# Patient Record
Sex: Male | Born: 1945 | Race: White | Hispanic: No | Smoking: Never smoker
Health system: Southern US, Community
[De-identification: ages and names within clinical notes are randomized; demographics above are authoritative.]

## PROBLEM LIST (undated history)

## (undated) DIAGNOSIS — E7800 Pure hypercholesterolemia, unspecified: Secondary | ICD-10-CM

## (undated) DIAGNOSIS — I10 Essential (primary) hypertension: Secondary | ICD-10-CM

## (undated) DIAGNOSIS — M5136 Other intervertebral disc degeneration, lumbar region: Secondary | ICD-10-CM

## (undated) DIAGNOSIS — M51369 Other intervertebral disc degeneration, lumbar region without mention of lumbar back pain or lower extremity pain: Secondary | ICD-10-CM

## (undated) DIAGNOSIS — D1720 Benign lipomatous neoplasm of skin and subcutaneous tissue of unspecified limb: Secondary | ICD-10-CM

## (undated) HISTORY — DX: Benign lipomatous neoplasm of skin and subcutaneous tissue of unspecified limb: D17.20

## (undated) HISTORY — DX: Other intervertebral disc degeneration, lumbar region without mention of lumbar back pain or lower extremity pain: M51.369

## (undated) HISTORY — PX: TONSILLECTOMY: SUR1361

## (undated) HISTORY — PX: INGUINAL HERNIA REPAIR: SUR1180

## (undated) HISTORY — PX: OTHER SURGICAL HISTORY: SHX169

## (undated) HISTORY — PX: CATARACT EXTRACTION, BILATERAL: SHX1313

## (undated) HISTORY — PX: BACK SURGERY: SHX140

## (undated) HISTORY — DX: Essential (primary) hypertension: I10

## (undated) HISTORY — DX: Other intervertebral disc degeneration, lumbar region: M51.36

---

## 2016-07-09 DIAGNOSIS — H2511 Age-related nuclear cataract, right eye: Secondary | ICD-10-CM | POA: Diagnosis not present

## 2016-08-12 ENCOUNTER — Other Ambulatory Visit: Payer: Self-pay | Admitting: Orthopedic Surgery

## 2016-08-12 DIAGNOSIS — M25562 Pain in left knee: Secondary | ICD-10-CM

## 2016-08-12 DIAGNOSIS — R531 Weakness: Secondary | ICD-10-CM

## 2016-08-12 DIAGNOSIS — M2392 Unspecified internal derangement of left knee: Secondary | ICD-10-CM

## 2016-08-20 ENCOUNTER — Ambulatory Visit
Admission: RE | Admit: 2016-08-20 | Discharge: 2016-08-20 | Disposition: A | Discharge status: Home / Self Care | Payer: Medicare Other | Source: Ambulatory Visit | Attending: Orthopedic Surgery | Admitting: Orthopedic Surgery

## 2016-08-20 DIAGNOSIS — M25562 Pain in left knee: Secondary | ICD-10-CM | POA: Diagnosis not present

## 2016-08-20 DIAGNOSIS — M2392 Unspecified internal derangement of left knee: Secondary | ICD-10-CM

## 2016-08-20 DIAGNOSIS — R531 Weakness: Secondary | ICD-10-CM

## 2016-09-01 DIAGNOSIS — S83242A Other tear of medial meniscus, current injury, left knee, initial encounter: Secondary | ICD-10-CM | POA: Diagnosis not present

## 2016-09-01 DIAGNOSIS — M1712 Unilateral primary osteoarthritis, left knee: Secondary | ICD-10-CM | POA: Diagnosis not present

## 2016-09-07 DIAGNOSIS — H2511 Age-related nuclear cataract, right eye: Secondary | ICD-10-CM | POA: Diagnosis not present

## 2016-09-07 DIAGNOSIS — H25811 Combined forms of age-related cataract, right eye: Secondary | ICD-10-CM | POA: Diagnosis not present

## 2016-09-15 DIAGNOSIS — H2512 Age-related nuclear cataract, left eye: Secondary | ICD-10-CM | POA: Diagnosis not present

## 2016-09-28 DIAGNOSIS — H2512 Age-related nuclear cataract, left eye: Secondary | ICD-10-CM | POA: Diagnosis not present

## 2016-09-28 DIAGNOSIS — H25811 Combined forms of age-related cataract, right eye: Secondary | ICD-10-CM | POA: Diagnosis not present

## 2016-10-26 DIAGNOSIS — Z125 Encounter for screening for malignant neoplasm of prostate: Secondary | ICD-10-CM | POA: Diagnosis not present

## 2016-10-26 DIAGNOSIS — E7800 Pure hypercholesterolemia, unspecified: Secondary | ICD-10-CM | POA: Diagnosis not present

## 2016-10-27 DIAGNOSIS — E7800 Pure hypercholesterolemia, unspecified: Secondary | ICD-10-CM | POA: Diagnosis not present

## 2016-10-27 DIAGNOSIS — Z125 Encounter for screening for malignant neoplasm of prostate: Secondary | ICD-10-CM | POA: Diagnosis not present

## 2016-11-19 DIAGNOSIS — H25813 Combined forms of age-related cataract, bilateral: Secondary | ICD-10-CM | POA: Diagnosis not present

## 2016-11-19 DIAGNOSIS — H40013 Open angle with borderline findings, low risk, bilateral: Secondary | ICD-10-CM | POA: Diagnosis not present

## 2016-11-19 DIAGNOSIS — H26491 Other secondary cataract, right eye: Secondary | ICD-10-CM | POA: Diagnosis not present

## 2016-12-10 DIAGNOSIS — H26492 Other secondary cataract, left eye: Secondary | ICD-10-CM | POA: Diagnosis not present

## 2016-12-10 DIAGNOSIS — Z961 Presence of intraocular lens: Secondary | ICD-10-CM | POA: Diagnosis not present

## 2017-02-19 DIAGNOSIS — D1801 Hemangioma of skin and subcutaneous tissue: Secondary | ICD-10-CM | POA: Diagnosis not present

## 2017-02-19 DIAGNOSIS — M79675 Pain in left toe(s): Secondary | ICD-10-CM | POA: Diagnosis not present

## 2017-03-18 DIAGNOSIS — H40013 Open angle with borderline findings, low risk, bilateral: Secondary | ICD-10-CM | POA: Diagnosis not present

## 2017-03-18 DIAGNOSIS — Z961 Presence of intraocular lens: Secondary | ICD-10-CM | POA: Diagnosis not present

## 2017-03-18 DIAGNOSIS — H26492 Other secondary cataract, left eye: Secondary | ICD-10-CM | POA: Diagnosis not present

## 2017-04-30 DIAGNOSIS — Z23 Encounter for immunization: Secondary | ICD-10-CM | POA: Diagnosis not present

## 2018-03-24 DIAGNOSIS — S40011A Contusion of right shoulder, initial encounter: Secondary | ICD-10-CM | POA: Diagnosis not present

## 2018-03-24 DIAGNOSIS — S9032XA Contusion of left foot, initial encounter: Secondary | ICD-10-CM | POA: Diagnosis not present

## 2018-03-24 DIAGNOSIS — S40021A Contusion of right upper arm, initial encounter: Secondary | ICD-10-CM | POA: Diagnosis not present

## 2018-03-24 DIAGNOSIS — S300XXA Contusion of lower back and pelvis, initial encounter: Secondary | ICD-10-CM | POA: Diagnosis not present

## 2018-05-16 DIAGNOSIS — Z125 Encounter for screening for malignant neoplasm of prostate: Secondary | ICD-10-CM | POA: Diagnosis not present

## 2018-05-16 DIAGNOSIS — Z1159 Encounter for screening for other viral diseases: Secondary | ICD-10-CM | POA: Diagnosis not present

## 2018-05-16 DIAGNOSIS — D1720 Benign lipomatous neoplasm of skin and subcutaneous tissue of unspecified limb: Secondary | ICD-10-CM | POA: Diagnosis not present

## 2018-05-16 DIAGNOSIS — Z0000 Encounter for general adult medical examination without abnormal findings: Secondary | ICD-10-CM | POA: Diagnosis not present

## 2018-05-16 DIAGNOSIS — Z23 Encounter for immunization: Secondary | ICD-10-CM | POA: Diagnosis not present

## 2018-05-16 DIAGNOSIS — E7800 Pure hypercholesterolemia, unspecified: Secondary | ICD-10-CM | POA: Diagnosis not present

## 2018-05-17 DIAGNOSIS — Z1159 Encounter for screening for other viral diseases: Secondary | ICD-10-CM | POA: Diagnosis not present

## 2018-05-17 DIAGNOSIS — Z0000 Encounter for general adult medical examination without abnormal findings: Secondary | ICD-10-CM | POA: Diagnosis not present

## 2018-05-17 DIAGNOSIS — Z125 Encounter for screening for malignant neoplasm of prostate: Secondary | ICD-10-CM | POA: Diagnosis not present

## 2018-05-17 DIAGNOSIS — E7800 Pure hypercholesterolemia, unspecified: Secondary | ICD-10-CM | POA: Diagnosis not present

## 2018-07-11 ENCOUNTER — Ambulatory Visit: Payer: Self-pay | Admitting: Surgery

## 2018-07-11 DIAGNOSIS — D1721 Benign lipomatous neoplasm of skin and subcutaneous tissue of right arm: Secondary | ICD-10-CM | POA: Diagnosis not present

## 2018-07-11 NOTE — H&P (Signed)
History of Present Illness Steve Ruiz. Ricahrd Schwager MD; 07/11/2018 10:17 AM) The patient is a 73 year old male who presents with a complaint of Mass. Referred by Dr. Radene Ou for right upper extremity lipomas  This is a 73 year old male in good health who presents with multiple subcutaneous lipomas over both upper extremities and upper torso. Most of these are asymptomatic. However he has a cluster of 3 lipomas over his right biceps that has become fairly large and has begun causing some discomfort. This has progressed to the point that he would like to have these removed to alleviate discomfort. These areas have never become infected. No imaging.   Past Surgical History Emeline Gins, [place]; 07/11/2018 9:54 AM) Colon Polyp Removal - Colonoscopy Spinal Surgery - Lower Back Tonsillectomy Vasectomy  Diagnostic Studies History Emeline Gins, [place]; 07/11/2018 9:54 AM) Colonoscopy 5-10 years ago  Allergies Emeline Gins, CMA; 07/11/2018 9:55 AM) No Known Drug Allergies [07/11/2018]: Allergies Reconciled  Medication History Emeline Gins, CMA; 07/11/2018 9:55 AM) Atorvastatin Calcium (10MG  Tablet, Oral) Active. Aspirin (81MG  Tablet DR, Oral) Active. ZyrTEC Allergy (10MG  Tablet, Oral) Active. Medications Reconciled  Social History Emeline Gins, [place]; 07/11/2018 9:54 AM) Alcohol use Moderate alcohol use. Caffeine use Carbonated beverages, Coffee. Illicit drug use Remotely quit drug use. Tobacco use Never smoker.  Family History Emeline Gins, [place]; 07/11/2018 9:54 AM) Cerebrovascular Accident Father. Respiratory Condition Mother. Thyroid problems Brother.  Other Problems Emeline Gins, [place]; 07/11/2018 9:54 AM) Hypercholesterolemia     Review of Systems Emeline Gins CMA; 07/11/2018 9:54 AM) General Not Present- Appetite Loss, Chills, Fatigue, Fever, Night Sweats, Weight Gain and Weight Loss. Skin Not Present- Change in Wart/Mole, Dryness, Hives, Jaundice, New  Lesions, Non-Healing Wounds, Rash and Ulcer. HEENT Present- Ringing in the Ears and Sinus Pain. Not Present- Earache, Hearing Loss, Hoarseness, Nose Bleed, Oral Ulcers, Seasonal Allergies, Sore Throat, Visual Disturbances, Wears glasses/contact lenses and Yellow Eyes. Respiratory Not Present- Bloody sputum, Chronic Cough, Difficulty Breathing, Snoring and Wheezing. Breast Not Present- Breast Mass, Breast Pain, Nipple Discharge and Skin Changes. Cardiovascular Present- Leg Cramps. Not Present- Chest Pain, Difficulty Breathing Lying Down, Palpitations, Rapid Heart Rate, Shortness of Breath and Swelling of Extremities. Gastrointestinal Present- Change in Bowel Habits. Not Present- Abdominal Pain, Bloating, Bloody Stool, Chronic diarrhea, Constipation, Difficulty Swallowing, Excessive gas, Gets full quickly at meals, Hemorrhoids, Indigestion, Nausea, Rectal Pain and Vomiting. Male Genitourinary Not Present- Blood in Urine, Change in Urinary Stream, Frequency, Impotence, Nocturia, Painful Urination, Urgency and Urine Leakage. Musculoskeletal Present- Joint Pain. Not Present- Back Pain, Joint Stiffness, Muscle Pain, Muscle Weakness and Swelling of Extremities. Neurological Not Present- Decreased Memory, Fainting, Headaches, Numbness, Seizures, Tingling, Tremor, Trouble walking and Weakness. Endocrine Not Present- Cold Intolerance, Excessive Hunger, Hair Changes, Heat Intolerance, Hot flashes and New Diabetes.  Vitals Emeline Gins CMA; 07/11/2018 9:55 AM) 07/11/2018 9:54 AM Weight: 158.25 lb Height: 70in Body Surface Area: 1.89 m Body Mass Index: 22.71 kg/m  Temp.: 97.2F  Pulse: 77 (Regular)  BP: 152/84 (Sitting, Left Arm, Standard)      Physical Exam Rodman Key K. Daisja Kessinger MD; 07/11/2018 10:19 AM)  The physical exam findings are as follows: Note:WDWN in NAD Right upper extremity - Protruding cluster of lipomas over biceps - 3 cm, 2 cm, 1 cm. No skin changes. Mildly tender to  palpation Multiple other smaller lipomas over right forearm/ right triceps region - asymptomatic    Assessment & Plan Rodman Key K. 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 The surgical procedure has been discussed with the patient. Potential risks, benefits, alternative treatments, and expected outcomes have been explained. All of the patient's questions at this time have been answered. The likelihood of reaching the patient's treatment goal is good. The patient understand the proposed surgical procedure and wishes to proceed.   Steve Ruiz. Georgette Dover, MD, [facility]  General/ Trauma Surgery Beeper [phone]  07/11/2018 10:19 AM

## 2018-07-22 DIAGNOSIS — M791 Myalgia, unspecified site: Secondary | ICD-10-CM | POA: Diagnosis not present

## 2018-08-02 DIAGNOSIS — D1721 Benign lipomatous neoplasm of skin and subcutaneous tissue of right arm: Secondary | ICD-10-CM | POA: Diagnosis not present

## 2018-12-20 DIAGNOSIS — M791 Myalgia, unspecified site: Secondary | ICD-10-CM | POA: Diagnosis not present

## 2018-12-20 DIAGNOSIS — E7800 Pure hypercholesterolemia, unspecified: Secondary | ICD-10-CM | POA: Diagnosis not present

## 2019-02-22 DIAGNOSIS — M542 Cervicalgia: Secondary | ICD-10-CM | POA: Diagnosis not present

## 2019-02-22 DIAGNOSIS — M25511 Pain in right shoulder: Secondary | ICD-10-CM | POA: Diagnosis not present

## 2019-02-22 DIAGNOSIS — M67912 Unspecified disorder of synovium and tendon, left shoulder: Secondary | ICD-10-CM | POA: Diagnosis not present

## 2019-02-28 DIAGNOSIS — S46012D Strain of muscle(s) and tendon(s) of the rotator cuff of left shoulder, subsequent encounter: Secondary | ICD-10-CM | POA: Diagnosis not present

## 2019-02-28 DIAGNOSIS — S46011D Strain of muscle(s) and tendon(s) of the rotator cuff of right shoulder, subsequent encounter: Secondary | ICD-10-CM | POA: Diagnosis not present

## 2019-02-28 DIAGNOSIS — M47812 Spondylosis without myelopathy or radiculopathy, cervical region: Secondary | ICD-10-CM | POA: Diagnosis not present

## 2019-03-06 DIAGNOSIS — S46012D Strain of muscle(s) and tendon(s) of the rotator cuff of left shoulder, subsequent encounter: Secondary | ICD-10-CM | POA: Diagnosis not present

## 2019-03-06 DIAGNOSIS — M47812 Spondylosis without myelopathy or radiculopathy, cervical region: Secondary | ICD-10-CM | POA: Diagnosis not present

## 2019-03-06 DIAGNOSIS — S46011D Strain of muscle(s) and tendon(s) of the rotator cuff of right shoulder, subsequent encounter: Secondary | ICD-10-CM | POA: Diagnosis not present

## 2019-03-08 DIAGNOSIS — S46012D Strain of muscle(s) and tendon(s) of the rotator cuff of left shoulder, subsequent encounter: Secondary | ICD-10-CM | POA: Diagnosis not present

## 2019-03-08 DIAGNOSIS — M47812 Spondylosis without myelopathy or radiculopathy, cervical region: Secondary | ICD-10-CM | POA: Diagnosis not present

## 2019-03-08 DIAGNOSIS — S46011D Strain of muscle(s) and tendon(s) of the rotator cuff of right shoulder, subsequent encounter: Secondary | ICD-10-CM | POA: Diagnosis not present

## 2019-03-14 ENCOUNTER — Other Ambulatory Visit: Payer: Self-pay

## 2019-03-14 ENCOUNTER — Emergency Department (HOSPITAL_COMMUNITY)
Admission: EM | Admit: 2019-03-14 | Discharge: 2019-03-14 | Disposition: A | Discharge status: Home / Self Care | Payer: Medicare Other | Attending: Emergency Medicine | Admitting: Emergency Medicine

## 2019-03-14 ENCOUNTER — Encounter (HOSPITAL_COMMUNITY): Payer: Self-pay

## 2019-03-14 ENCOUNTER — Emergency Department (HOSPITAL_COMMUNITY): Payer: Medicare Other

## 2019-03-14 DIAGNOSIS — R4020 Unspecified coma: Secondary | ICD-10-CM | POA: Diagnosis not present

## 2019-03-14 DIAGNOSIS — R42 Dizziness and giddiness: Secondary | ICD-10-CM | POA: Diagnosis present

## 2019-03-14 DIAGNOSIS — I10 Essential (primary) hypertension: Secondary | ICD-10-CM | POA: Diagnosis not present

## 2019-03-14 HISTORY — DX: Pure hypercholesterolemia, unspecified: E78.00

## 2019-03-14 LAB — COMPREHENSIVE METABOLIC PANEL
ALT: 15 U/L (ref 0–44)
AST: 22 U/L (ref 15–41)
Albumin: 3.7 g/dL (ref 3.5–5.0)
Alkaline Phosphatase: 74 U/L (ref 38–126)
Anion gap: 11 (ref 5–15)
BUN: 19 mg/dL (ref 8–23)
CO2: 25 mmol/L (ref 22–32)
Calcium: 9.1 mg/dL (ref 8.9–10.3)
Chloride: 106 mmol/L (ref 98–111)
Creatinine, Ser: 0.78 mg/dL (ref 0.61–1.24)
GFR calc Af Amer: >60 mL/min (ref >60)
GFR calc non Af Amer: >60 mL/min (ref >60)
Glucose, Bld: 105 mg/dL — ABNORMAL HIGH (ref 70–99)
Potassium: 4 mmol/L (ref 3.5–5.1)
Sodium: 142 mmol/L (ref 135–145)
Total Bilirubin: 1.4 mg/dL — ABNORMAL HIGH (ref 0.3–1.2)
Total Protein: 6.3 g/dL — ABNORMAL LOW (ref 6.5–8.1)

## 2019-03-14 LAB — CBC WITH DIFFERENTIAL/PLATELET
Abs Immature Granulocytes: 0.01 10*3/uL (ref 0.00–0.07)
Basophils Absolute: 0 10*3/uL (ref 0.0–0.1)
Basophils Relative: 1 %
Eosinophils Absolute: 0.1 10*3/uL (ref 0.0–0.5)
Eosinophils Relative: 1 %
HCT: 42.8 % (ref 39.0–52.0)
Hemoglobin: 14 g/dL (ref 13.0–17.0)
Immature Granulocytes: 0 %
Lymphocytes Relative: 15 %
Lymphs Abs: 0.9 10*3/uL (ref 0.7–4.0)
MCH: 34.1 pg — ABNORMAL HIGH (ref 26.0–34.0)
MCHC: 32.7 g/dL (ref 30.0–36.0)
MCV: 104.4 fL — ABNORMAL HIGH (ref 80.0–100.0)
Monocytes Absolute: 0.6 10*3/uL (ref 0.1–1.0)
Monocytes Relative: 10 %
Neutro Abs: 4.4 10*3/uL (ref 1.7–7.7)
Neutrophils Relative %: 73 %
Platelets: 250 10*3/uL (ref 150–400)
RBC: 4.1 MIL/uL — ABNORMAL LOW (ref 4.22–5.81)
RDW: 11.9 % (ref 11.5–15.5)
WBC: 6.1 10*3/uL (ref 4.0–10.5)
nRBC: 0 % (ref 0.0–0.2)

## 2019-03-14 MED ORDER — LISINOPRIL 10 MG PO TABS: 10.0000 mg | ORAL_TABLET | Freq: Every day | ORAL | 0 refills | Status: DC

## 2019-03-14 NOTE — ED Triage Notes (Signed)
Patient reports that he was at the beach Saturday and states he was not drinking liquids, working in the heat and states he thought his speech was slurred. Patient states he went in the house rested and next day he felt "jumpy" and felt like he was not in control of everything. Patient states he has been feeling better each day and has consciously tried to rehydrate. Patient states he was able to walk 9 miles yesterday and the day before

## 2019-03-14 NOTE — Discharge Instructions (Addendum)
Call your doctor today to schedule follow-up for your blood pressure

## 2019-03-14 NOTE — ED Provider Notes (Signed)
[facility] DEPT Provider Note   CSN: [id number] Arrival date & time: 03/14/19  [id number]     History   Chief Complaint Chief Complaint  Patient presents with  . Dizziness    HPI RYER KERWICK is a 73 y.o. male.     73 year old male who this weekend felt lightheaded and dizzy after working outside.  Patient states he felt off balance and progressively weak.  Denies any headache.  States symptoms became better when he went inside.  Over the last several days he has been feeling better.  He cannot walk unassisted.  Denies any focal weakness.  No visual changes.  No chest or abdominal discomfort.  No medications were used for this.  Is concerned that he may have had a stroke.     Past Medical History:  Diagnosis Date  . High cholesterol     There are no active problems to display for this patient.   Past Surgical History:  Procedure Laterality Date  . BACK SURGERY          Home Medications    Prior to Admission medications   Not on File    Family History Family History  Problem Relation Age of Onset  . Emphysema Mother   . Stroke Father     Social History Social History   Tobacco Use  . Smoking status: Never Smoker  . Smokeless tobacco: Never Used  Substance Use Topics  . Alcohol use: Yes  . Drug use: Never     Allergies   Patient has no known allergies.   Review of Systems Review of Systems  All other systems reviewed and are negative.    Physical Exam Updated Vital Signs BP (!) 204/88 (BP Location: Left Arm)   Pulse (!) 57   Temp 98 F (36.7 C) (Oral)   Resp 16   Ht 1.778 m (5\' 10" )   Wt 68 kg   SpO2 98%   BMI 21.52 kg/m   Physical Exam Vitals signs and nursing note reviewed.  Constitutional:      General: He is not in acute distress.    Appearance: Normal appearance. He is well-developed. He is not toxic-appearing.  HENT:     Head: Normocephalic and atraumatic.  Eyes:     General: Lids are  normal.     Conjunctiva/sclera: Conjunctivae normal.     Pupils: Pupils are equal, round, and reactive to light.  Neck:     Musculoskeletal: Normal range of motion and neck supple.     Thyroid: No thyroid mass.     Trachea: No tracheal deviation.  Cardiovascular:     Rate and Rhythm: Normal rate and regular rhythm.     Heart sounds: Normal heart sounds. No murmur. No gallop.   Pulmonary:     Effort: Pulmonary effort is normal. No respiratory distress.     Breath sounds: Normal breath sounds. No stridor. No decreased breath sounds, wheezing, rhonchi or rales.  Abdominal:     General: Bowel sounds are normal. There is no distension.     Palpations: Abdomen is soft.     Tenderness: There is no abdominal tenderness. There is no rebound.  Musculoskeletal: Normal range of motion.        General: No tenderness.  Skin:    General: Skin is warm and dry.     Findings: No abrasion or rash.  Neurological:     Mental Status: He is alert and oriented to person, place, and time.  GCS: GCS eye subscore is 4. GCS verbal subscore is 5. GCS motor subscore is 6.     Cranial Nerves: No cranial nerve deficit.     Sensory: No sensory deficit.     Motor: Weakness present. No tremor.     Coordination: Coordination normal.     Gait: Gait and tandem walk normal.     Comments: Strength is 5 of 5 throughout  Psychiatric:        Speech: Speech normal.        Behavior: Behavior normal.      ED Treatments / Results  Labs (all labs ordered are listed, but only abnormal results are displayed) Labs Reviewed  CBC WITH DIFFERENTIAL/PLATELET  COMPREHENSIVE METABOLIC PANEL    EKG None  Radiology No results found.  Procedures Procedures (including critical care time)  Medications Ordered in ED Medications - No data to display   Initial Impression / Assessment and Plan / ED Course  I have reviewed the triage vital signs and the nursing notes.  Pertinent labs & imaging results that were  available during my care of the patient were reviewed by me and considered in my medical decision making (see chart for details).        Patient's laboratory studies reviewed and without significant abnormality.  Head CT without acute findings.  Blood pressure has been elevated here and suspect this is etiology of patient's symptoms.  Will start on lisinopril and patient follow-up with his doctor  Final Clinical Impressions(s) / ED Diagnoses   Final diagnoses:  None    ED Discharge Orders    None       Lacretia Leigh, MD 03/14/19 1122

## 2019-03-20 ENCOUNTER — Other Ambulatory Visit: Payer: Self-pay | Admitting: Family Medicine

## 2019-03-20 DIAGNOSIS — R42 Dizziness and giddiness: Secondary | ICD-10-CM | POA: Diagnosis not present

## 2019-03-20 DIAGNOSIS — Q670 Congenital facial asymmetry: Secondary | ICD-10-CM | POA: Diagnosis not present

## 2019-03-20 DIAGNOSIS — Z6821 Body mass index (BMI) 21.0-21.9, adult: Secondary | ICD-10-CM | POA: Diagnosis not present

## 2019-03-20 DIAGNOSIS — E7800 Pure hypercholesterolemia, unspecified: Secondary | ICD-10-CM | POA: Diagnosis not present

## 2019-03-21 ENCOUNTER — Encounter: Payer: Self-pay | Admitting: *Deleted

## 2019-03-21 ENCOUNTER — Other Ambulatory Visit: Payer: Self-pay

## 2019-03-21 ENCOUNTER — Other Ambulatory Visit (HOSPITAL_COMMUNITY): Payer: Self-pay | Admitting: Family Medicine

## 2019-03-21 ENCOUNTER — Ambulatory Visit (HOSPITAL_COMMUNITY)
Admission: RE | Admit: 2019-03-21 | Discharge: 2019-03-21 | Disposition: A | Discharge status: Home / Self Care | Payer: Medicare Other | Source: Ambulatory Visit | Attending: Family Medicine | Admitting: Family Medicine

## 2019-03-21 DIAGNOSIS — Q670 Congenital facial asymmetry: Secondary | ICD-10-CM | POA: Diagnosis not present

## 2019-03-21 DIAGNOSIS — R42 Dizziness and giddiness: Secondary | ICD-10-CM | POA: Insufficient documentation

## 2019-03-22 ENCOUNTER — Encounter: Payer: Self-pay | Admitting: Neurology

## 2019-03-22 ENCOUNTER — Ambulatory Visit (INDEPENDENT_AMBULATORY_CARE_PROVIDER_SITE_OTHER): Payer: Medicare Other | Admitting: Neurology

## 2019-03-22 VITALS — BP 168/80 | HR 58 | Temp 98.1°F | Ht 70.0 in | Wt 150.5 lb

## 2019-03-22 DIAGNOSIS — R9089 Other abnormal findings on diagnostic imaging of central nervous system: Secondary | ICD-10-CM | POA: Diagnosis not present

## 2019-03-22 DIAGNOSIS — R531 Weakness: Secondary | ICD-10-CM | POA: Diagnosis not present

## 2019-03-22 DIAGNOSIS — I639 Cerebral infarction, unspecified: Secondary | ICD-10-CM

## 2019-03-22 NOTE — Progress Notes (Signed)
PATIENT: Steve Ruiz DOB: 02-27-1946  Chief Complaint  Patient presents with  . r/o TIA    He is here with his daughter, Steve Ruiz.  Reports an episode of stumbling gait and slurred speech over Labor Day weekend.  Felt he was dehydrated.  A few days later, he had another episode of unsteadiness, mild right facial droop, difficulty using right hand (gives the example of having a hard time brushing his teeth).   He went to ED and had CT head.  He had a MRI brain on 03/21/2019 and has not been provided with the results.   Marland Kitchen PCP    [place], Steve [place], Steve Ruiz     HISTORICAL  Steve Ruiz is a 73 year old male, seen in request by his primary care Steve Ruiz for evaluation of stroke, initial evaluation was on March 22, 2019, she is accompanied by her daughter Steve Ruiz at today's visit.   I have reviewed and summarized the referring note from the referring physician. He had a history of hyperlipidemia, has been taking Lipitor 10 mg daily, aspirin 81 mg for many years, is very active, walk 10 miles each day, on March 11, 2019, he missed his breath first, walked outside for few hours, then around 1 PM, he separately felt stumbling, slurred speech, right side heaviness, also noticed right facial droop, right upper extremity clumsiness, symptoms has been persistent since its onset, he described difficulty driving, when he turned his neck to the right side, need a while to refocus, but overall his symptoms has much improved over the past few days, he still has mild clumsiness of his right hand, noticed that when he was writing, brushing his teeth, or walking  I personally reviewed CT head on March 14, 2019, no acute abnormality, mild small vessel disease  MRI of the brain March 21, 2019, 8 mm T2 bright focus in the left pons, also positive on DWI, subacute stroke versus developmental venous anomaly, radiology has suggested MRI of brain with contrast  REVIEW OF SYSTEMS: Full 14 system  review of systems performed and notable only for as above All other review of systems were negative.  ALLERGIES: Allergies  Allergen Reactions  . Simvastatin Other (See Comments)    Joint pain    HOME MEDICATIONS: Current Outpatient Medications  Medication Sig Dispense Refill  . aspirin EC 81 MG tablet Take 81 mg by mouth daily.    Marland Kitchen atorvastatin (LIPITOR) 10 MG tablet Take 10 mg by mouth daily.    . cetirizine-pseudoephedrine (ZYRTEC-D ALLERGY & CONGESTION) 5-120 MG tablet Take 1 tablet by mouth 2 (two) times daily as needed for allergies.    Marland Kitchen ibuprofen (ADVIL) 200 MG tablet Take 200 mg by mouth as needed.    Marland Kitchen lisinopril (ZESTRIL) 10 MG tablet Take 1 tablet (10 mg total) by mouth daily. 30 tablet 0   No current facility-administered medications for this visit.     PAST MEDICAL HISTORY: Past Medical History:  Diagnosis Date  . DDD (degenerative disc disease), lumbar   . High cholesterol   . Hypertension   . Lipoma of arm     PAST SURGICAL HISTORY: Past Surgical History:  Procedure Laterality Date  . arm surgery Right    lipoma removal  . BACK SURGERY    . CATARACT EXTRACTION, BILATERAL    . INGUINAL HERNIA REPAIR    . TONSILLECTOMY      FAMILY HISTORY: Family History  Problem Relation Age of Onset  . Emphysema Mother   .  Hypertension Mother   . Stroke Father   . Diabetes Father   . Cancer Brother        unsure of origin - started in throat  . Heart disease Brother        stent    SOCIAL HISTORY: Social History   Socioeconomic History  . Marital status: Legally Separated    Spouse name: Not on file  . Number of children: 3  . Years of education: 92  . Highest education level: High school graduate  Occupational History  . Occupation: Tree surgeon  Social Needs  . Financial resource strain: Not on file  . Food insecurity    Worry: Not on file    Inability: Not on file  . Transportation needs    Medical: Not on file    Non-medical: Not on file   Tobacco Use  . Smoking status: Never Smoker  . Smokeless tobacco: Never Used  Substance and Sexual Activity  . Alcohol use: Yes    Comment: several drinks per week  . Drug use: Never  . Sexual activity: Not on file  Lifestyle  . Physical activity    Days per week: Not on file    Minutes per session: Not on file  . Stress: Not on file  Relationships  . Social Herbalist on phone: Not on file    Gets together: Not on file    Attends religious service: Not on file    Active member of club or organization: Not on file    Attends meetings of clubs or organizations: Not on file    Relationship status: Not on file  . Intimate partner violence    Fear of current or ex partner: Not on file    Emotionally abused: Not on file    Physically abused: Not on file    Forced sexual activity: Not on file  Other Topics Concern  . Not on file  Social History Narrative   Lives at home alone.   4-5 cups caffeine per day.   Right-handed.     PHYSICAL EXAM   Vitals:   03/22/19 1523  BP: (!) 168/80  Pulse: (!) 58  Temp: 98.1 F (36.7 C)  Weight: 150 lb 8 oz (68.3 kg)  Height: 5\' 10"  (1.778 m)    Not recorded      Body mass index is 21.59 kg/m.  PHYSICAL EXAMNIATION:  Gen: NAD, conversant, well nourised, well groomed                     Cardiovascular: Regular rate rhythm, no peripheral edema, warm, nontender. Eyes: Conjunctivae clear without exudates or hemorrhage Neck: Supple, no carotid bruits. Pulmonary: Clear to auscultation bilaterally   NEUROLOGICAL EXAM:  MENTAL STATUS: Speech:    Speech is normal; fluent and spontaneous with normal comprehension.  Cognition:     Orientation to time, place and person     Normal recent and remote memory     Normal Attention span and concentration     Normal Language, naming, repeating,spontaneous speech     Fund of knowledge   CRANIAL NERVES: CN II: Visual fields are full to confrontation.   Pupils are round equal and  briskly reactive to light. CN III, IV, VI: extraocular movement are normal. No ptosis. CN V: Facial sensation is intact to pinprick in all 3 divisions bilaterally. Corneal responses are intact.  CN VII: Mild right lower face weakness CN VIII: Hearing is normal to  causal conversation. CN IX, X: Palate elevates symmetrically. Phonation is normal. CN XI: Head turning and shoulder shrug are intact CN XII: Tongue is midline with normal movements and no atrophy.  MOTOR: There is no pronator drift of out-stretched arms. Muscle bulk and tone are normal. Muscle strength is normal.  REFLEXES: Mild hyperreflexia on the right upper and lower extremity. Plantar responses are flexor.  SENSORY: Intact to light touch, pinprick, positional sensation and vibratory sensation are intact in fingers and toes.  COORDINATION: Rapid alternating movements and fine finger movements are intact. There is no dysmetria on finger-to-nose and heel-knee-shin.    GAIT/STANCE: Posture is normal. Gait is steady with normal steps, base, arm swing, and turning. Heel and toe walking are normal. Tandem gait is normal.  Romberg is absent.   DIAGNOSTIC DATA (LABS, IMAGING, TESTING) - I reviewed patient records, labs, notes, testing and imaging myself where available.   ASSESSMENT AND PLAN  Steve Ruiz is a 73 y.o. male   Acute onset of right-sided weakness,  Most suggestive of left pontine stroke,  CT angiogram of head and neck, echocardiogram,  Radiology raisded the possibility of left pontine venous anormaly, will also do MRI of the brain with contrast  Continue aspirin 81 mg daily, keep well hydration  Steve Ruiz, M.D. Ph.D.  [facility] [address] [place], [place] [address] Ph: [phone] Fax: [phone]  CC: Referring Provider

## 2019-03-23 ENCOUNTER — Telehealth: Payer: Self-pay | Admitting: Neurology

## 2019-03-23 NOTE — Telephone Encounter (Signed)
[place] of [place] order sent to GI. No auth they will reach out to the patient.  

## 2019-03-23 NOTE — Telephone Encounter (Signed)
For the MRI Brain can you switch the order to a MRI Brain w/wo contrast because they don't do just with contrast. Thank you!

## 2019-03-23 NOTE — Addendum Note (Signed)
Addended by: Marcial Pacas on: 03/23/2019 09:53 AM   Modules accepted: Orders

## 2019-03-23 NOTE — Telephone Encounter (Signed)
Order was placed.

## 2019-03-23 NOTE — Telephone Encounter (Signed)
[place]ic[place] order sent to GI. No auth they will reach out to the patient to schedule.  

## 2019-03-24 DIAGNOSIS — Q670 Congenital facial asymmetry: Secondary | ICD-10-CM | POA: Diagnosis not present

## 2019-03-24 DIAGNOSIS — Z6821 Body mass index (BMI) 21.0-21.9, adult: Secondary | ICD-10-CM | POA: Diagnosis not present

## 2019-03-24 DIAGNOSIS — R42 Dizziness and giddiness: Secondary | ICD-10-CM | POA: Diagnosis not present

## 2019-04-04 ENCOUNTER — Ambulatory Visit
Admission: RE | Admit: 2019-04-04 | Discharge: 2019-04-04 | Disposition: A | Discharge status: Home / Self Care | Payer: Medicare Other | Source: Ambulatory Visit | Attending: Neurology | Admitting: Neurology

## 2019-04-04 ENCOUNTER — Other Ambulatory Visit: Payer: Self-pay

## 2019-04-04 ENCOUNTER — Telehealth: Payer: Self-pay | Admitting: Neurology

## 2019-04-04 DIAGNOSIS — R531 Weakness: Secondary | ICD-10-CM

## 2019-04-04 DIAGNOSIS — I63233 Cerebral infarction due to unspecified occlusion or stenosis of bilateral carotid arteries: Secondary | ICD-10-CM | POA: Diagnosis not present

## 2019-04-04 DIAGNOSIS — I639 Cerebral infarction, unspecified: Secondary | ICD-10-CM

## 2019-04-04 DIAGNOSIS — R9089 Other abnormal findings on diagnostic imaging of central nervous system: Secondary | ICD-10-CM

## 2019-04-04 MED ORDER — IOPAMIDOL (ISOVUE-370) INJECTION 76%
75.0000 mL | Freq: Once | INTRAVENOUS | Status: AC | PRN
  Administered 2019-04-04: 75 mL via INTRAVENOUS

## 2019-04-04 NOTE — Telephone Encounter (Signed)
Please call patient, CT angiogram of  neck showed no significant stenosis.  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  Soft plaque with atherosclerotic irregularity multifocal mild stenosis within the basilar.  He should continue aspirin daily

## 2019-04-05 NOTE — Telephone Encounter (Signed)
I called the patient and provided him with the results below.  He verbalized understanding and will continue his aspirin 81mg  daily.

## 2019-04-11 ENCOUNTER — Ambulatory Visit
Admission: RE | Admit: 2019-04-11 | Discharge: 2019-04-11 | Disposition: A | Discharge status: Home / Self Care | Payer: Medicare Other | Source: Ambulatory Visit | Attending: Neurology | Admitting: Neurology

## 2019-04-11 ENCOUNTER — Other Ambulatory Visit: Payer: Self-pay

## 2019-04-11 DIAGNOSIS — R531 Weakness: Secondary | ICD-10-CM | POA: Diagnosis not present

## 2019-04-11 DIAGNOSIS — R9089 Other abnormal findings on diagnostic imaging of central nervous system: Secondary | ICD-10-CM

## 2019-04-11 DIAGNOSIS — I639 Cerebral infarction, unspecified: Secondary | ICD-10-CM

## 2019-04-11 MED ORDER — GADOBENATE DIMEGLUMINE 529 MG/ML IV SOLN
14.0000 mL | Freq: Once | INTRAVENOUS | Status: AC | PRN
  Administered 2019-04-11: 14 mL via INTRAVENOUS

## 2019-04-17 ENCOUNTER — Telehealth: Payer: Self-pay | Admitting: Neurology

## 2019-04-17 MED ORDER — LISINOPRIL 10 MG PO TABS: 10.0000 mg | ORAL_TABLET | Freq: Every day | ORAL | 4 refills | Status: DC

## 2019-04-17 MED ORDER — LISINOPRIL 10 MG PO TABS: ORAL_TABLET | ORAL | 3 refills | Status: AC

## 2019-04-17 NOTE — Telephone Encounter (Signed)
Yes, He may take lisinopril 10mg  one and 1/2 tab daily

## 2019-04-17 NOTE — Telephone Encounter (Signed)
Left message requesting a return call.

## 2019-04-17 NOTE — Telephone Encounter (Signed)
  I was giving a small supply of  Lisinopril, 10 mg at the emergency room by Dr Zenia Resides.  Until Dr Krista Blue determines my medicine needs after tests are complete I need a refill from Dr Krista Blue for additional blood pressure pills  Steve Ruiz   Please call patient, check on his blood pressure numbers, I have called in lisinopril 10 mg daily,

## 2019-04-17 NOTE — Telephone Encounter (Signed)
I spoke to the patient and he verbalized understanding of the results.  He will keep his pending follow up for further review with Dr. Krista Blue.

## 2019-04-17 NOTE — Telephone Encounter (Signed)
Pt returning call please call back °

## 2019-04-17 NOTE — Addendum Note (Signed)
Addended by: Noberto Retort C on: 04/17/2019 04:53 PM   Modules accepted: Orders

## 2019-04-17 NOTE — Telephone Encounter (Signed)
I spoke to the patient.  He has been checking his blood pressure twice daily.  His systolic pressures have been anywhere from 130s to 180s (most readings are in the 140s-150s).  His diastolic pressures have been between 60-80 (most readings in 70s).  These were recorded while he was taking lisinopril 10mg  daily.  He would like to know if his lisinopril dosage should be increased.  His last four BP readings have been the following: 153/81, 164/85, 167/87, 170/88.

## 2019-04-17 NOTE — Telephone Encounter (Signed)
Left message requesting a call back.

## 2019-04-17 NOTE — Telephone Encounter (Signed)
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  - No abnormal lesions are seen on post contrast views.  No acute findings.

## 2019-04-17 NOTE — Telephone Encounter (Signed)
I spoke to the patient and he is agreeable to take the increased dose of lisinopril.  New rx sent to the pharmacy.

## 2019-05-05 DIAGNOSIS — Z23 Encounter for immunization: Secondary | ICD-10-CM | POA: Diagnosis not present

## 2019-05-08 ENCOUNTER — Ambulatory Visit (HOSPITAL_COMMUNITY)
Admission: RE | Admit: 2019-05-08 | Discharge: 2019-05-08 | Disposition: A | Discharge status: Home / Self Care | Payer: Medicare Other | Source: Ambulatory Visit | Attending: Neurology | Admitting: Neurology

## 2019-05-08 ENCOUNTER — Other Ambulatory Visit: Payer: Self-pay

## 2019-05-08 DIAGNOSIS — R531 Weakness: Secondary | ICD-10-CM | POA: Insufficient documentation

## 2019-05-08 DIAGNOSIS — R9089 Other abnormal findings on diagnostic imaging of central nervous system: Secondary | ICD-10-CM | POA: Diagnosis not present

## 2019-05-08 DIAGNOSIS — I639 Cerebral infarction, unspecified: Secondary | ICD-10-CM | POA: Insufficient documentation

## 2019-05-08 DIAGNOSIS — I351 Nonrheumatic aortic (valve) insufficiency: Secondary | ICD-10-CM | POA: Insufficient documentation

## 2019-05-08 NOTE — Progress Notes (Signed)
  Echocardiogram 2D Echocardiogram has been performed.  Johny Chess 05/08/2019, 11:46 AM

## 2019-05-17 ENCOUNTER — Encounter: Payer: Self-pay | Admitting: *Deleted

## 2019-05-23 ENCOUNTER — Ambulatory Visit (INDEPENDENT_AMBULATORY_CARE_PROVIDER_SITE_OTHER): Payer: Medicare Other | Admitting: Neurology

## 2019-05-23 ENCOUNTER — Encounter: Payer: Self-pay | Admitting: Neurology

## 2019-05-23 ENCOUNTER — Other Ambulatory Visit: Payer: Self-pay

## 2019-05-23 VITALS — BP 161/77 | HR 58 | Temp 97.9°F | Ht 70.0 in | Wt 150.5 lb

## 2019-05-23 DIAGNOSIS — I639 Cerebral infarction, unspecified: Secondary | ICD-10-CM

## 2019-05-23 NOTE — Progress Notes (Signed)
PATIENT: Steve Ruiz DOB: [birth year]  Chief Complaint  Patient presents with  . Right-sided weakness    He is here with his daughter, Steve Ruiz.  Recently increased lisinopril from 10mg  to 20mg  daily.  His blood pressure has still been elevated.  Says he does have some workplace stress.  He would like to review his test results in more detail today.      HISTORICAL  Steve Ruiz is a 73 year old male, seen in request by his primary care [facility] for evaluation of stroke, initial evaluation was on March 22, 2019, she is accompanied by her daughter Steve Ruiz at today's visit.   I have reviewed and summarized the referring note from the referring physician. He had a history of hyperlipidemia, has been taking Lipitor 10 mg daily, aspirin 81 mg for many years, is very active, walk 10 miles each day, on March 11, 2019, he missed his breath first, walked outside for few hours, then around 1 PM, he separately felt stumbling, slurred speech, right side heaviness, also noticed right facial droop, right upper extremity clumsiness, symptoms has been persistent since its onset, he described difficulty driving, when he turned his neck to the right side, need a while to refocus, but overall his symptoms has much improved over the past few days, he still has mild clumsiness of his right hand, noticed that when he was writing, brushing his teeth, or walking  I personally reviewed CT head on March 14, 2019, no acute abnormality, mild small vessel disease  MRI of the brain March 21, 2019, 8 mm T2 bright focus in the left pons, also positive on DWI, subacute stroke versus developmental venous anomaly, radiology has suggested MRI of brain with contrast  Update May 23 2019: He is accompanied by his daughter at today's clinical visit, he walk 8 miles a day, compliant with his medication, home blood pressure was in the range of 140-160/80-90 range, his right side weakness almost disappeared,  he is taking aspirin 81 mg daily  We personally reviewed repeat MRI of the brain with without contrast April 11, 2019, left pontine ischemic stroke, no contrast-enhancement, mild to moderate supratentorium small vessel disease.  CT angiogram of head showed intracranial atherosclerotic disease, calcified plaque with moderate segmental stenosis at the right paraclinoid internal carotid artery  CT angiogram of the neck showed no significant large vessel disease  Echocardiogram showed ejection fraction of 55 to 60%  Laboratory evaluations, normal CMP with creatinine of 0.81, normal CBC with hemoglobin 14.6, LDL of 92, HDL of 59  REVIEW OF SYSTEMS: Full 14 system review of systems performed and notable only for as above All other review of systems were negative.  ALLERGIES: Allergies  Allergen Reactions  . Simvastatin Other (See Comments)    Joint pain    HOME MEDICATIONS: Current Outpatient Medications  Medication Sig Dispense Refill  . aspirin EC 81 MG tablet Take 81 mg by mouth daily.    Marland Kitchen atorvastatin (LIPITOR) 10 MG tablet Take 10 mg by mouth daily.    Marland Kitchen ibuprofen (ADVIL) 200 MG tablet Take 200 mg by mouth as needed.    Marland Kitchen lisinopril (ZESTRIL) 10 MG tablet Take 1.5 tablets daily. (Patient taking differently: 20 mg. Take 2 tablets daily.) 135 tablet 3   No current facility-administered medications for this visit.     PAST MEDICAL HISTORY: Past Medical History:  Diagnosis Date  . DDD (degenerative disc disease), lumbar   . High cholesterol   . Hypertension   .  Lipoma of arm     PAST SURGICAL HISTORY: Past Surgical History:  Procedure Laterality Date  . arm surgery Right    lipoma removal  . BACK SURGERY    . CATARACT EXTRACTION, BILATERAL    . INGUINAL HERNIA REPAIR    . TONSILLECTOMY      FAMILY HISTORY: Family History  Problem Relation Age of Onset  . Emphysema Mother   . Hypertension Mother   . Stroke Father   . Diabetes Father   . Cancer Brother         unsure of origin - started in throat  . Heart disease Brother        stent    SOCIAL HISTORY: Social History   Socioeconomic History  . Marital status: Legally Separated    Spouse name: Not on file  . Number of children: 3  . Years of education: 76  . Highest education level: High school graduate  Occupational History  . Occupation: Tree surgeon  Social Needs  . Financial resource strain: Not on file  . Food insecurity    Worry: Not on file    Inability: Not on file  . Transportation needs    Medical: Not on file    Non-medical: Not on file  Tobacco Use  . Smoking status: Never Smoker  . Smokeless tobacco: Never Used  Substance and Sexual Activity  . Alcohol use: Yes    Comment: several drinks per week  . Drug use: Never  . Sexual activity: Not on file  Lifestyle  . Physical activity    Days per week: Not on file    Minutes per session: Not on file  . Stress: Not on file  Relationships  . Social Herbalist on phone: Not on file    Gets together: Not on file    Attends religious service: Not on file    Active member of club or organization: Not on file    Attends meetings of clubs or organizations: Not on file    Relationship status: Not on file  . Intimate partner violence    Fear of current or ex partner: Not on file    Emotionally abused: Not on file    Physically abused: Not on file    Forced sexual activity: Not on file  Other Topics Concern  . Not on file  Social History Narrative   Lives at home alone.   4-5 cups caffeine per day.   Right-handed.     PHYSICAL EXAM   Vitals:   05/23/19 1004  BP: (!) 161/77  Pulse: (!) 58  Temp: 97.9 F (36.6 C)  Weight: 150 lb 8 oz (68.3 kg)  Height: 5\' 10"  (1.778 m)    Not recorded      Body mass index is 21.59 kg/m.  PHYSICAL EXAMNIATION:  Gen: NAD, conversant, well nourised, well groomed                     Cardiovascular: Regular rate rhythm, no peripheral edema, warm, nontender.  Eyes: Conjunctivae clear without exudates or hemorrhage Neck: Supple, no carotid bruits. Pulmonary: Clear to auscultation bilaterally   NEUROLOGICAL EXAM:  MENTAL STATUS: Speech:    Speech is normal; fluent and spontaneous with normal comprehension.  Cognition:     Orientation to time, place and person     Normal recent and remote memory     Normal Attention span and concentration     Normal Language, naming, repeating,spontaneous  speech     Fund of knowledge   CRANIAL NERVES: CN II: Visual fields are full to confrontation.   Pupils are round equal and briskly reactive to light. CN III, IV, VI: extraocular movement are normal. No ptosis. CN V: Facial sensation is intact to pinprick in all 3 divisions bilaterally. Corneal responses are intact.  CN VII: Mild right lower face weakness CN VIII: Hearing is normal to causal conversation. CN IX, X: Palate elevates symmetrically. Phonation is normal. CN XI: Head turning and shoulder shrug are intact CN XII: Tongue is midline with normal movements and no atrophy.  MOTOR: There is no pronator drift of out-stretched arms. Muscle bulk and tone are normal. Muscle strength is normal.  REFLEXES: Mild hyperreflexia on the right upper and lower extremity. Plantar responses are flexor.  SENSORY: Intact to light touch, pinprick, positional sensation and vibratory sensation are intact in fingers and toes.  COORDINATION: Rapid alternating movements and fine finger movements are intact. There is no dysmetria on finger-to-nose and heel-knee-shin.    GAIT/STANCE: Posture is normal. Gait is steady with normal steps, base, arm swing, and turning. Heel and toe walking are normal. Tandem gait is normal.  Romberg is absent.   DIAGNOSTIC DATA (LABS, IMAGING, TESTING) - I reviewed patient records, labs, notes, testing and imaging myself where available.   ASSESSMENT AND PLAN  SHEM KLOUDA is a 73 y.o. 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 Ph.D.  [facility] [address] [place], [place] [address] Ph: [phone] Fax: [phone]  CC: Referring Provider

## 2019-06-21 ENCOUNTER — Other Ambulatory Visit: Payer: Self-pay

## 2019-06-21 ENCOUNTER — Ambulatory Visit: Payer: Medicare Other | Attending: Internal Medicine

## 2019-06-21 DIAGNOSIS — Z20822 Contact with and (suspected) exposure to covid-19: Secondary | ICD-10-CM

## 2019-06-23 LAB — NOVEL CORONAVIRUS, NAA: SARS-CoV-2, NAA: NOT DETECTED

## 2019-11-13 DIAGNOSIS — I10 Essential (primary) hypertension: Secondary | ICD-10-CM | POA: Diagnosis not present

## 2019-11-13 DIAGNOSIS — Z125 Encounter for screening for malignant neoplasm of prostate: Secondary | ICD-10-CM | POA: Diagnosis not present

## 2019-11-13 DIAGNOSIS — Z1211 Encounter for screening for malignant neoplasm of colon: Secondary | ICD-10-CM | POA: Diagnosis not present

## 2019-11-13 DIAGNOSIS — E7800 Pure hypercholesterolemia, unspecified: Secondary | ICD-10-CM | POA: Diagnosis not present

## 2020-03-12 DIAGNOSIS — Z1159 Encounter for screening for other viral diseases: Secondary | ICD-10-CM | POA: Diagnosis not present

## 2020-03-14 DIAGNOSIS — K640 First degree hemorrhoids: Secondary | ICD-10-CM | POA: Diagnosis not present

## 2020-03-14 DIAGNOSIS — Z1211 Encounter for screening for malignant neoplasm of colon: Secondary | ICD-10-CM | POA: Diagnosis not present

## 2020-03-14 DIAGNOSIS — K5730 Diverticulosis of large intestine without perforation or abscess without bleeding: Secondary | ICD-10-CM | POA: Diagnosis not present

## 2020-03-14 DIAGNOSIS — K635 Polyp of colon: Secondary | ICD-10-CM | POA: Diagnosis not present

## 2020-03-19 DIAGNOSIS — K635 Polyp of colon: Secondary | ICD-10-CM | POA: Diagnosis not present

## 2020-04-17 DIAGNOSIS — Z23 Encounter for immunization: Secondary | ICD-10-CM | POA: Diagnosis not present

## 2020-05-15 DIAGNOSIS — Z23 Encounter for immunization: Secondary | ICD-10-CM | POA: Diagnosis not present

## 2020-08-28 DIAGNOSIS — E7800 Pure hypercholesterolemia, unspecified: Secondary | ICD-10-CM | POA: Diagnosis not present

## 2020-08-28 DIAGNOSIS — Z125 Encounter for screening for malignant neoplasm of prostate: Secondary | ICD-10-CM | POA: Diagnosis not present

## 2020-09-03 DIAGNOSIS — I10 Essential (primary) hypertension: Secondary | ICD-10-CM | POA: Diagnosis not present

## 2020-09-03 DIAGNOSIS — Z0000 Encounter for general adult medical examination without abnormal findings: Secondary | ICD-10-CM | POA: Diagnosis not present

## 2020-09-03 DIAGNOSIS — Z1389 Encounter for screening for other disorder: Secondary | ICD-10-CM | POA: Diagnosis not present

## 2020-09-03 DIAGNOSIS — E7800 Pure hypercholesterolemia, unspecified: Secondary | ICD-10-CM | POA: Diagnosis not present

## 2020-11-16 IMAGING — CT CT HEAD W/O CM
3 series · 16 of 47 positions shown, 19 images · non-contrast
Comparison: None.

CLINICAL DATA: 72-year-old male with altered level of
consciousness. Dehydration versus stroke-like symptoms.

EXAM:
CT HEAD WITHOUT CONTRAST
TECHNIQUE: Contiguous axial images were obtained from the base of the skull
through the vertex without intravenous contrast.

[Series 2: head wo · axial · 0.42mm/px · z∈[-113,+17]mm · 10 of 32 slices shown, 13 images]
[im 3/32  brain]
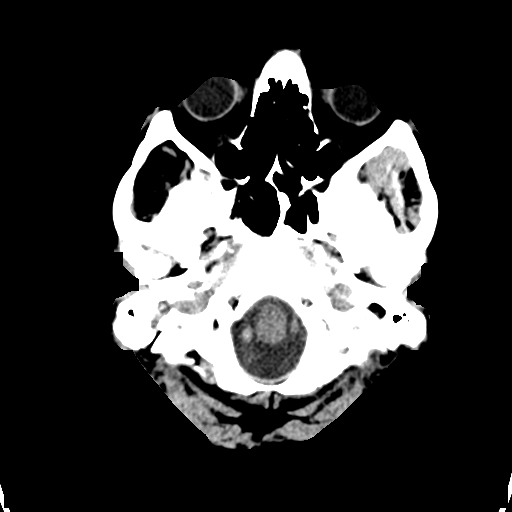
[im 3/32  bone]
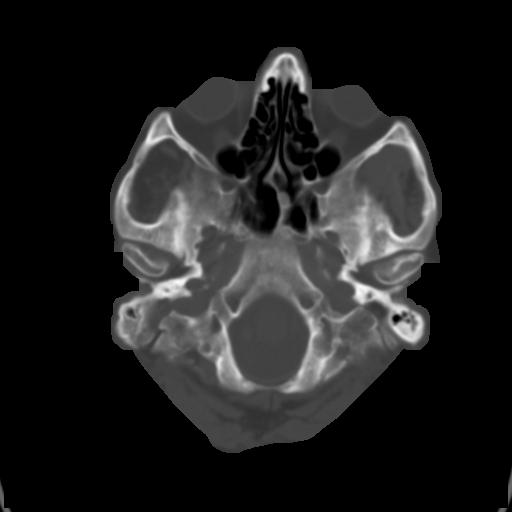
[im 6/32  brain]
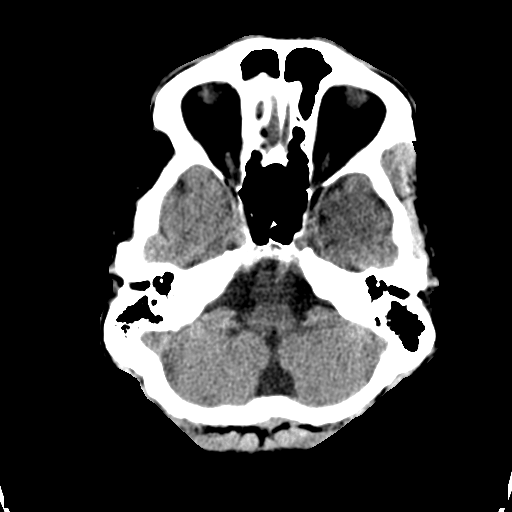
[im 9/32  brain]
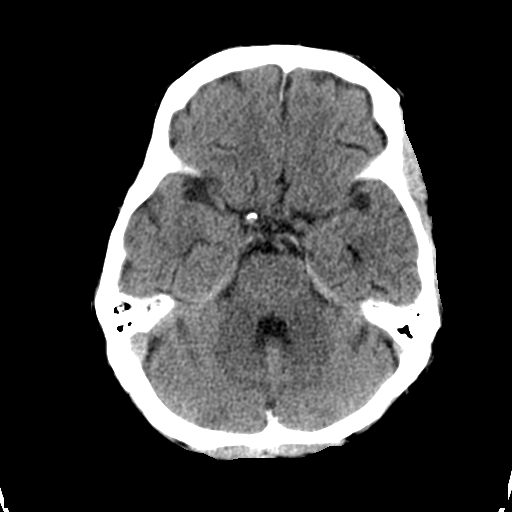
[im 11/32  brain]
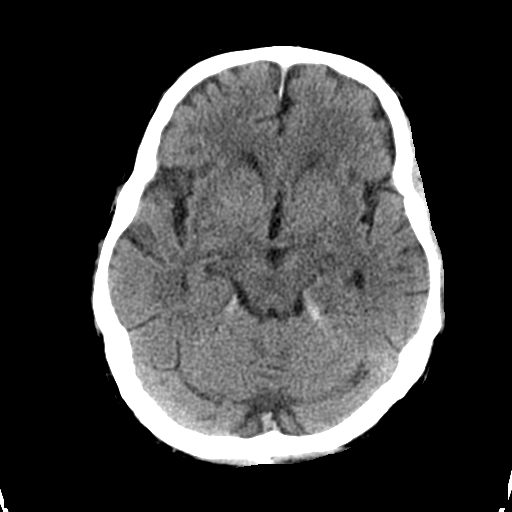
[im 14/32  brain]
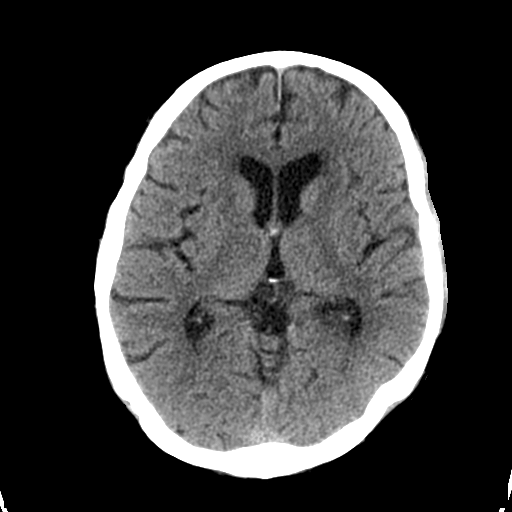
[im 14/32  bone]
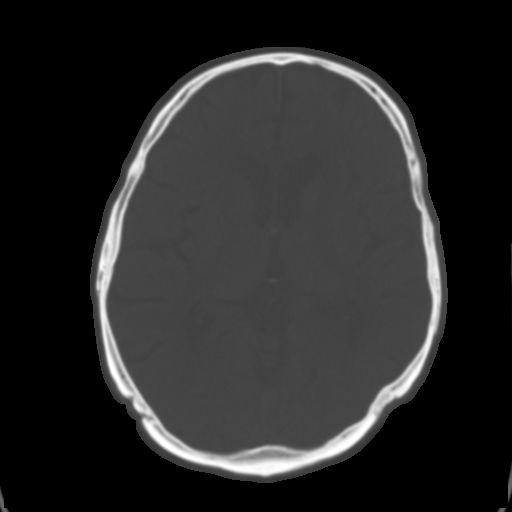
[im 18/32  brain]
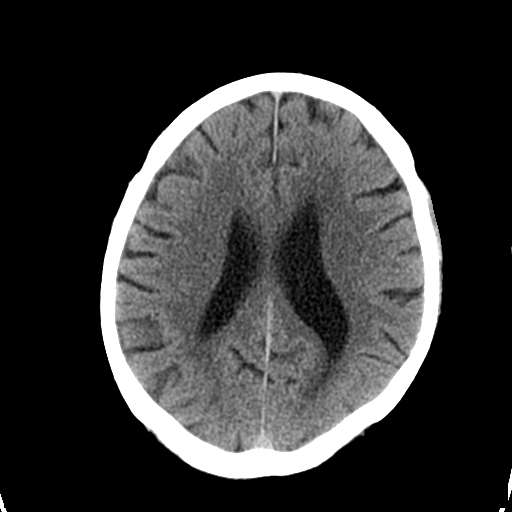
[im 21/32  brain]
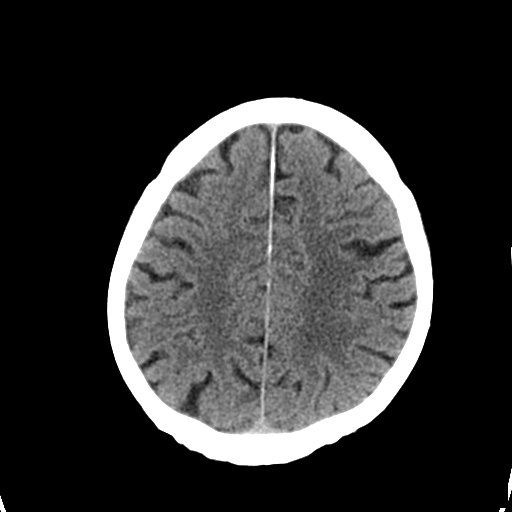
[im 24/32  brain]
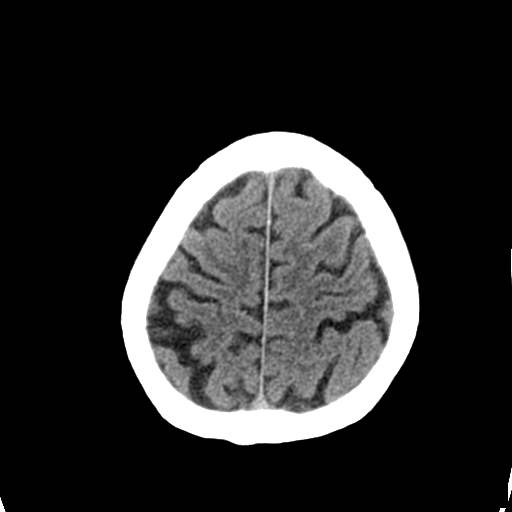
[im 26/32  brain]
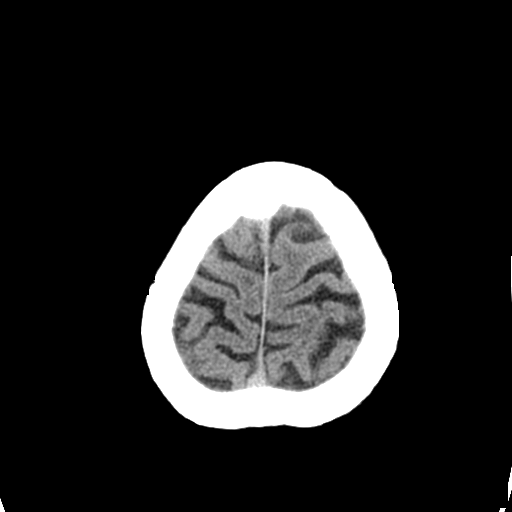
[im 26/32  bone]
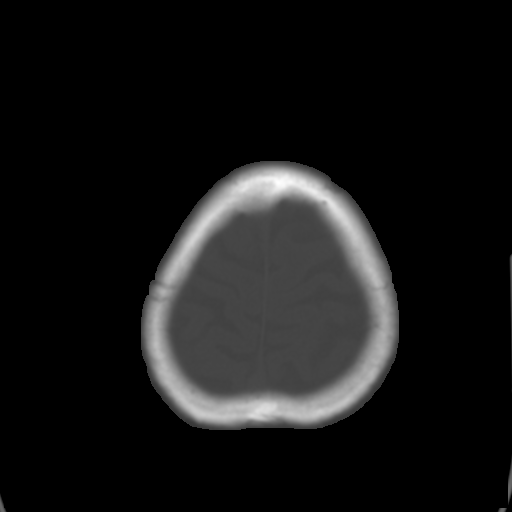
[im 29/32  brain]
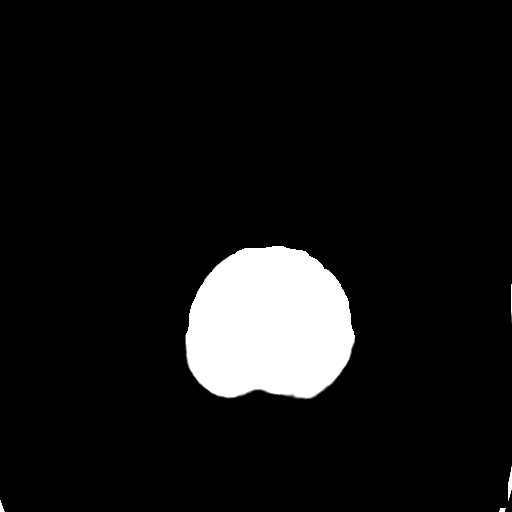

[Series 4: coronal soft tissue · coronal · 0.30mm/px · 3 of 63 slices shown]
[im 21/63  brain]
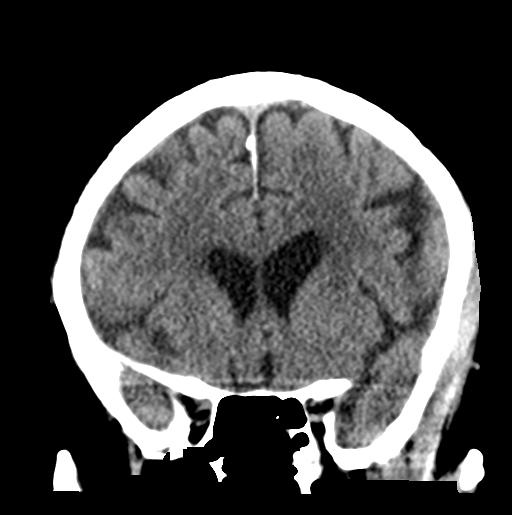
[im 28/63  brain]
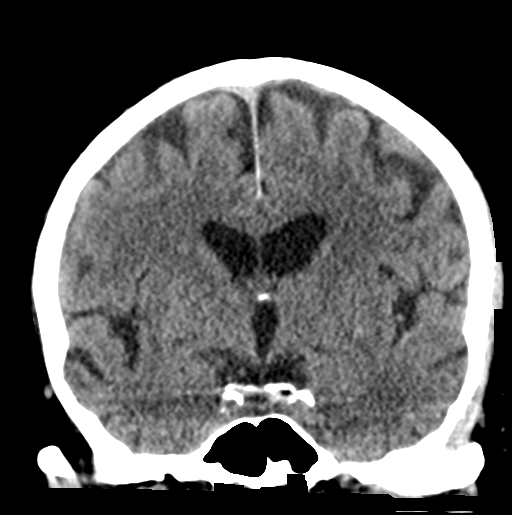
[im 35/63  brain]
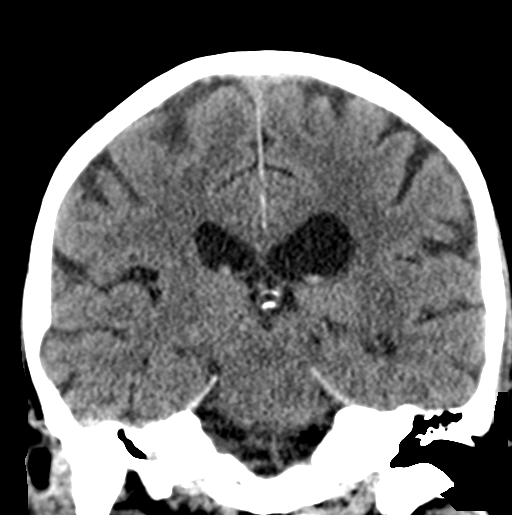

[Series 5: sagittal soft tissue · sagittal · 0.31mm/px · 3 of 54 slices shown]
[im 18/54  brain]
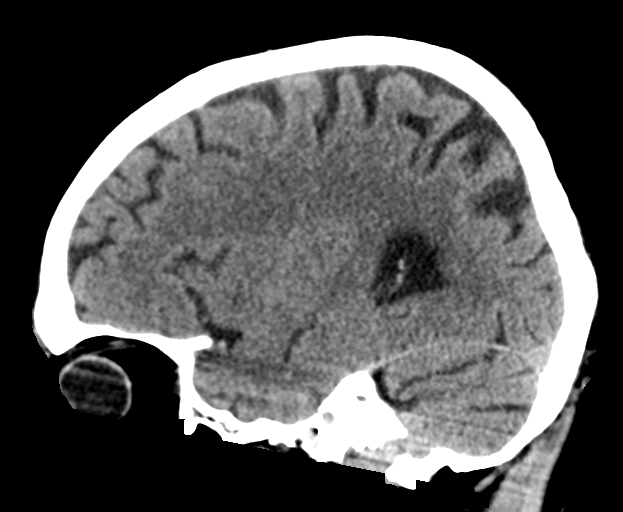
[im 27/54  brain]
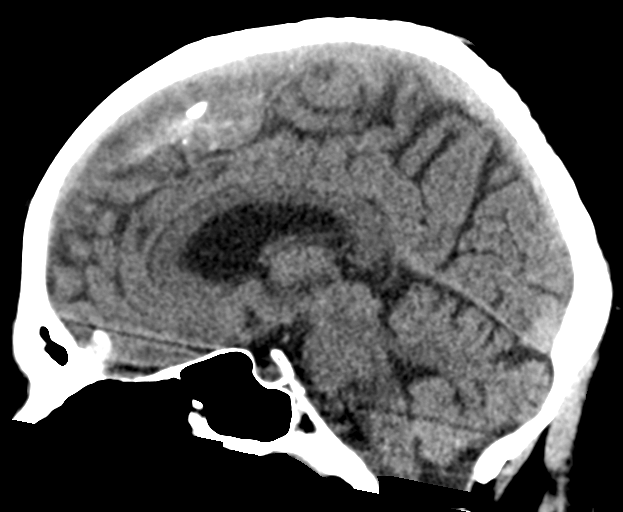
[im 36/54  brain]
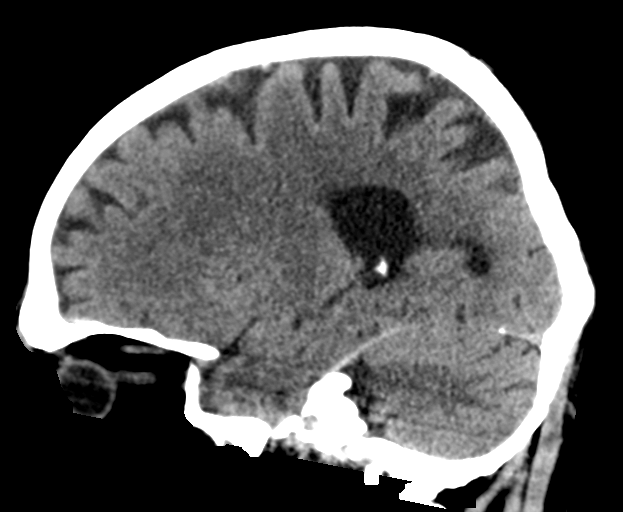

[16 of 47 positions shown; findings below may reference images not displayed]

FINDINGS: Brain: No evidence of acute infarction, hemorrhage, hydrocephalus,
extra-axial collection or mass lesion/mass effect. Minimal
periventricular white matter hypoattenuation most consistent with
chronic microvascular ischemic white matter disease.

Vascular: No hyperdense vessel or unexpected calcification. Mild
calcifications in the cavernous carotid arteries.

Skull: Normal. Negative for fracture or focal lesion.

Sinuses/Orbits: No acute finding.

Other: None.
IMPRESSION: 1. No acute intracranial abnormality.
2. Mild chronic microvascular ischemic white matter disease.

## 2020-11-22 DIAGNOSIS — R252 Cramp and spasm: Secondary | ICD-10-CM | POA: Diagnosis not present

## 2021-02-13 DIAGNOSIS — R3911 Hesitancy of micturition: Secondary | ICD-10-CM | POA: Diagnosis not present

## 2021-02-13 DIAGNOSIS — L989 Disorder of the skin and subcutaneous tissue, unspecified: Secondary | ICD-10-CM | POA: Diagnosis not present

## 2021-02-13 DIAGNOSIS — R42 Dizziness and giddiness: Secondary | ICD-10-CM | POA: Diagnosis not present

## 2021-02-13 DIAGNOSIS — L568 Other specified acute skin changes due to ultraviolet radiation: Secondary | ICD-10-CM | POA: Diagnosis not present

## 2021-03-12 DIAGNOSIS — E7800 Pure hypercholesterolemia, unspecified: Secondary | ICD-10-CM | POA: Diagnosis not present

## 2021-03-12 DIAGNOSIS — N401 Enlarged prostate with lower urinary tract symptoms: Secondary | ICD-10-CM | POA: Diagnosis not present

## 2021-03-12 DIAGNOSIS — I10 Essential (primary) hypertension: Secondary | ICD-10-CM | POA: Diagnosis not present

## 2021-03-28 DIAGNOSIS — L538 Other specified erythematous conditions: Secondary | ICD-10-CM | POA: Diagnosis not present

## 2021-03-28 DIAGNOSIS — R208 Other disturbances of skin sensation: Secondary | ICD-10-CM | POA: Diagnosis not present

## 2021-03-28 DIAGNOSIS — L820 Inflamed seborrheic keratosis: Secondary | ICD-10-CM | POA: Diagnosis not present

## 2021-03-28 DIAGNOSIS — L814 Other melanin hyperpigmentation: Secondary | ICD-10-CM | POA: Diagnosis not present

## 2021-03-28 DIAGNOSIS — Z789 Other specified health status: Secondary | ICD-10-CM | POA: Diagnosis not present

## 2021-03-28 DIAGNOSIS — L821 Other seborrheic keratosis: Secondary | ICD-10-CM | POA: Diagnosis not present

## 2021-03-28 DIAGNOSIS — L728 Other follicular cysts of the skin and subcutaneous tissue: Secondary | ICD-10-CM | POA: Diagnosis not present

## 2021-04-17 DIAGNOSIS — Z23 Encounter for immunization: Secondary | ICD-10-CM | POA: Diagnosis not present

## 2021-07-19 DIAGNOSIS — Z20822 Contact with and (suspected) exposure to covid-19: Secondary | ICD-10-CM | POA: Diagnosis not present

## 2021-08-20 DIAGNOSIS — I10 Essential (primary) hypertension: Secondary | ICD-10-CM | POA: Diagnosis not present

## 2021-08-20 DIAGNOSIS — R053 Chronic cough: Secondary | ICD-10-CM | POA: Diagnosis not present

## 2022-04-22 DIAGNOSIS — E7800 Pure hypercholesterolemia, unspecified: Secondary | ICD-10-CM | POA: Diagnosis not present

## 2022-04-22 DIAGNOSIS — N401 Enlarged prostate with lower urinary tract symptoms: Secondary | ICD-10-CM | POA: Diagnosis not present

## 2022-04-22 DIAGNOSIS — Z0000 Encounter for general adult medical examination without abnormal findings: Secondary | ICD-10-CM | POA: Diagnosis not present

## 2022-04-22 DIAGNOSIS — I10 Essential (primary) hypertension: Secondary | ICD-10-CM | POA: Diagnosis not present

## 2022-04-29 DIAGNOSIS — I10 Essential (primary) hypertension: Secondary | ICD-10-CM | POA: Diagnosis not present

## 2022-04-29 DIAGNOSIS — Z6823 Body mass index (BMI) 23.0-23.9, adult: Secondary | ICD-10-CM | POA: Diagnosis not present

## 2022-04-29 DIAGNOSIS — Z0000 Encounter for general adult medical examination without abnormal findings: Secondary | ICD-10-CM | POA: Diagnosis not present

## 2022-04-29 DIAGNOSIS — N401 Enlarged prostate with lower urinary tract symptoms: Secondary | ICD-10-CM | POA: Diagnosis not present

## 2022-04-29 DIAGNOSIS — Z8673 Personal history of transient ischemic attack (TIA), and cerebral infarction without residual deficits: Secondary | ICD-10-CM | POA: Diagnosis not present

## 2022-04-29 DIAGNOSIS — L989 Disorder of the skin and subcutaneous tissue, unspecified: Secondary | ICD-10-CM | POA: Diagnosis not present

## 2022-04-29 DIAGNOSIS — R0989 Other specified symptoms and signs involving the circulatory and respiratory systems: Secondary | ICD-10-CM | POA: Diagnosis not present

## 2022-04-29 DIAGNOSIS — Z23 Encounter for immunization: Secondary | ICD-10-CM | POA: Diagnosis not present

## 2022-04-29 DIAGNOSIS — E7800 Pure hypercholesterolemia, unspecified: Secondary | ICD-10-CM | POA: Diagnosis not present

## 2022-05-12 ENCOUNTER — Other Ambulatory Visit: Payer: Self-pay | Admitting: Family Medicine

## 2022-05-12 DIAGNOSIS — R0989 Other specified symptoms and signs involving the circulatory and respiratory systems: Secondary | ICD-10-CM

## 2022-05-26 ENCOUNTER — Ambulatory Visit
Admission: RE | Admit: 2022-05-26 | Discharge: 2022-05-26 | Disposition: A | Discharge status: Home / Self Care | Payer: Medicare Other | Source: Ambulatory Visit | Attending: Family Medicine | Admitting: Family Medicine

## 2022-05-26 ENCOUNTER — Other Ambulatory Visit: Payer: Medicare Other

## 2022-05-26 DIAGNOSIS — I771 Stricture of artery: Secondary | ICD-10-CM | POA: Diagnosis not present

## 2022-05-26 DIAGNOSIS — R0989 Other specified symptoms and signs involving the circulatory and respiratory systems: Secondary | ICD-10-CM | POA: Diagnosis not present

## 2022-07-30 DIAGNOSIS — E7800 Pure hypercholesterolemia, unspecified: Secondary | ICD-10-CM | POA: Diagnosis not present

## 2022-07-30 DIAGNOSIS — I10 Essential (primary) hypertension: Secondary | ICD-10-CM | POA: Diagnosis not present

## 2022-07-30 DIAGNOSIS — Z79899 Other long term (current) drug therapy: Secondary | ICD-10-CM | POA: Diagnosis not present

## 2022-07-30 DIAGNOSIS — Z8673 Personal history of transient ischemic attack (TIA), and cerebral infarction without residual deficits: Secondary | ICD-10-CM | POA: Diagnosis not present

## 2022-07-30 DIAGNOSIS — Z6822 Body mass index (BMI) 22.0-22.9, adult: Secondary | ICD-10-CM | POA: Diagnosis not present

## 2022-07-30 DIAGNOSIS — N401 Enlarged prostate with lower urinary tract symptoms: Secondary | ICD-10-CM | POA: Diagnosis not present

## 2022-08-20 DIAGNOSIS — Z6821 Body mass index (BMI) 21.0-21.9, adult: Secondary | ICD-10-CM | POA: Diagnosis not present

## 2022-08-20 DIAGNOSIS — L0291 Cutaneous abscess, unspecified: Secondary | ICD-10-CM | POA: Diagnosis not present

## 2022-08-20 DIAGNOSIS — L02212 Cutaneous abscess of back [any part, except buttock]: Secondary | ICD-10-CM | POA: Diagnosis not present

## 2022-08-20 DIAGNOSIS — A480 Gas gangrene: Secondary | ICD-10-CM | POA: Diagnosis not present

## 2022-08-20 DIAGNOSIS — M549 Dorsalgia, unspecified: Secondary | ICD-10-CM | POA: Diagnosis not present

## 2022-09-02 DIAGNOSIS — Z6822 Body mass index (BMI) 22.0-22.9, adult: Secondary | ICD-10-CM | POA: Diagnosis not present

## 2022-09-02 DIAGNOSIS — A480 Gas gangrene: Secondary | ICD-10-CM | POA: Diagnosis not present

## 2022-09-02 DIAGNOSIS — Z8673 Personal history of transient ischemic attack (TIA), and cerebral infarction without residual deficits: Secondary | ICD-10-CM | POA: Diagnosis not present

## 2022-09-02 DIAGNOSIS — I10 Essential (primary) hypertension: Secondary | ICD-10-CM | POA: Diagnosis not present

## 2022-09-02 DIAGNOSIS — E7800 Pure hypercholesterolemia, unspecified: Secondary | ICD-10-CM | POA: Diagnosis not present

## 2022-09-16 DIAGNOSIS — L814 Other melanin hyperpigmentation: Secondary | ICD-10-CM | POA: Diagnosis not present

## 2022-09-16 DIAGNOSIS — D225 Melanocytic nevi of trunk: Secondary | ICD-10-CM | POA: Diagnosis not present

## 2022-09-16 DIAGNOSIS — Z1283 Encounter for screening for malignant neoplasm of skin: Secondary | ICD-10-CM | POA: Diagnosis not present

## 2022-09-16 DIAGNOSIS — L720 Epidermal cyst: Secondary | ICD-10-CM | POA: Diagnosis not present

## 2022-09-16 DIAGNOSIS — D485 Neoplasm of uncertain behavior of skin: Secondary | ICD-10-CM | POA: Diagnosis not present

## 2023-02-16 DIAGNOSIS — I10 Essential (primary) hypertension: Secondary | ICD-10-CM | POA: Diagnosis not present

## 2023-02-16 DIAGNOSIS — Z23 Encounter for immunization: Secondary | ICD-10-CM | POA: Diagnosis not present

## 2023-02-16 DIAGNOSIS — R3911 Hesitancy of micturition: Secondary | ICD-10-CM | POA: Diagnosis not present

## 2023-02-16 DIAGNOSIS — E7800 Pure hypercholesterolemia, unspecified: Secondary | ICD-10-CM | POA: Diagnosis not present
